# Patient Record
Sex: Female | Born: 1990 | Race: Black or African American | Hispanic: No | Marital: Single | Smoking: Never smoker
Health system: Southern US, Community
[De-identification: ages and names within clinical notes are randomized; demographics above are authoritative.]

---

## 2020-05-22 ENCOUNTER — Other Ambulatory Visit: Payer: Self-pay

## 2020-05-22 ENCOUNTER — Emergency Department (HOSPITAL_COMMUNITY)
Admission: EM | Admit: 2020-05-22 | Discharge: 2020-05-22 | Disposition: A | Discharge status: Home / Self Care | Payer: HRSA Program | Attending: Emergency Medicine | Admitting: Emergency Medicine

## 2020-05-22 ENCOUNTER — Emergency Department (HOSPITAL_COMMUNITY): Payer: HRSA Program

## 2020-05-22 ENCOUNTER — Encounter (HOSPITAL_COMMUNITY): Payer: Self-pay

## 2020-05-22 DIAGNOSIS — R1013 Epigastric pain: Secondary | ICD-10-CM | POA: Diagnosis not present

## 2020-05-22 DIAGNOSIS — U071 COVID-19: Secondary | ICD-10-CM

## 2020-05-22 DIAGNOSIS — R1111 Vomiting without nausea: Secondary | ICD-10-CM | POA: Diagnosis not present

## 2020-05-22 DIAGNOSIS — R0602 Shortness of breath: Secondary | ICD-10-CM | POA: Diagnosis present

## 2020-05-22 DIAGNOSIS — R112 Nausea with vomiting, unspecified: Secondary | ICD-10-CM

## 2020-05-22 LAB — CBC
HCT: 47.4 % — ABNORMAL HIGH (ref 36.0–46.0)
Hemoglobin: 15.4 g/dL — ABNORMAL HIGH (ref 12.0–15.0)
MCH: 29.2 pg (ref 26.0–34.0)
MCHC: 32.5 g/dL (ref 30.0–36.0)
MCV: 89.9 fL (ref 80.0–100.0)
Platelets: 179 10*3/uL (ref 150–400)
RBC: 5.27 MIL/uL — ABNORMAL HIGH (ref 3.87–5.11)
RDW: 13.2 % (ref 11.5–15.5)
WBC: 3.9 10*3/uL — ABNORMAL LOW (ref 4.0–10.5)
nRBC: 0 % (ref 0.0–0.2)

## 2020-05-22 LAB — COMPREHENSIVE METABOLIC PANEL
ALT: 28 U/L (ref 0–44)
AST: 39 U/L (ref 15–41)
Albumin: 4.3 g/dL (ref 3.5–5.0)
Alkaline Phosphatase: 43 U/L (ref 38–126)
Anion gap: 17 — ABNORMAL HIGH (ref 5–15)
BUN: 13 mg/dL (ref 6–20)
CO2: 20 mmol/L — ABNORMAL LOW (ref 22–32)
Calcium: 9.2 mg/dL (ref 8.9–10.3)
Chloride: 98 mmol/L (ref 98–111)
Creatinine, Ser: 1.03 mg/dL — ABNORMAL HIGH (ref 0.44–1.00)
GFR, Estimated: >60 mL/min (ref >60)
Glucose, Bld: 87 mg/dL (ref 70–99)
Potassium: 3.4 mmol/L — ABNORMAL LOW (ref 3.5–5.1)
Sodium: 135 mmol/L (ref 135–145)
Total Bilirubin: 1.1 mg/dL (ref 0.3–1.2)
Total Protein: 7.7 g/dL (ref 6.5–8.1)

## 2020-05-22 LAB — RESP PANEL BY RT-PCR (FLU A&B, COVID) ARPGX2
Influenza A by PCR: NEGATIVE
Influenza B by PCR: NEGATIVE
SARS Coronavirus 2 by RT PCR: POSITIVE — AB

## 2020-05-22 LAB — LIPASE, BLOOD: Lipase: 26 U/L (ref 11–51)

## 2020-05-22 LAB — I-STAT BETA HCG BLOOD, ED (MC, WL, AP ONLY): I-stat hCG, quantitative: <5 m[IU]/mL (ref <5)

## 2020-05-22 MED ORDER — ONDANSETRON 4 MG PO TBDP: ORAL_TABLET | ORAL | 0 refills | Status: AC

## 2020-05-22 MED ORDER — ONDANSETRON 4 MG PO TBDP
4.0000 mg | ORAL_TABLET | Freq: Once | ORAL | Status: AC | PRN
  Administered 2020-05-22: 4 mg via ORAL
  Filled 2020-05-22: qty 1

## 2020-05-22 MED ORDER — SODIUM CHLORIDE 0.9 % IV BOLUS
1000.0000 mL | Freq: Once | INTRAVENOUS | Status: AC
  Administered 2020-05-22: 1000 mL via INTRAVENOUS

## 2020-05-22 MED ORDER — PROMETHAZINE HCL 25 MG/ML IJ SOLN
25.0000 mg | Freq: Once | INTRAMUSCULAR | Status: AC
  Administered 2020-05-22: 25 mg via INTRAVENOUS
  Filled 2020-05-22: qty 1

## 2020-05-22 MED ORDER — ONDANSETRON HCL 4 MG/2ML IJ SOLN
4.0000 mg | Freq: Once | INTRAMUSCULAR | Status: AC
  Administered 2020-05-22: 4 mg via INTRAVENOUS
  Filled 2020-05-22: qty 2

## 2020-05-22 NOTE — ED Triage Notes (Signed)
Pt reports she is here today due to N&V and abd pain. Pt states " I think I might have covid" Pt reports she is unable to keep anything down x4 days.

## 2020-05-22 NOTE — ED Provider Notes (Signed)
[facility] EMERGENCY DEPARTMENT Provider Note   CSN: [id number] Arrival date & time: 05/22/20  1513     History Chief Complaint  Patient presents with  . Emesis  . Abdominal Pain    Kathryn Buchanan is a 30 y.o. female otherwise healthy here presenting with vomiting and possible COVID.  Patient states that she has been vomiting for the last 3 to 4 days.  She denies any recent gatherings or known COVID exposures.  Patient states that her kids are not sick.  Patient has not vaccinated against COVID however.  Patient states that she also has some epigastric pain.  She has some mild shortness of breath and sore throat as well.  Patient denies any medical problems.   The history is provided by the patient.       History reviewed. No pertinent past medical history.  There are no problems to display for this patient.   History reviewed. No pertinent surgical history.   OB History   No obstetric history on file.     History reviewed. No pertinent family history.  Social History   Tobacco Use  . Smoking status: Never Smoker  . Smokeless tobacco: Never Used    Home Medications Prior to Admission medications   Not on File    Allergies    Patient has no known allergies.  Review of Systems   Review of Systems  Gastrointestinal: Positive for abdominal pain and vomiting.  All other systems reviewed and are negative.   Physical Exam Updated Vital Signs BP 129/87 (BP Location: Right Arm)   Pulse 90   Temp 98.3 F (36.8 C) (Oral)   Resp 18   SpO2 97%   Physical Exam Vitals and nursing note reviewed.  Constitutional:      Comments: Uncomfortable, vomiting   HENT:     Head: Normocephalic.     Mouth/Throat:     Comments: MM dry  Eyes:     Extraocular Movements: Extraocular movements intact.  Cardiovascular:     Rate and Rhythm: Normal rate and regular rhythm.     Heart sounds: Normal heart sounds.  Pulmonary:     Effort: Pulmonary effort is  normal.     Comments: Diminished bilaterally  Abdominal:     General: Abdomen is flat. Bowel sounds are normal.     Palpations: Abdomen is soft.  Skin:    General: Skin is warm.     Capillary Refill: Capillary refill takes less than 2 seconds.  Neurological:     General: No focal deficit present.  Psychiatric:        Mood and Affect: Mood normal.        Behavior: Behavior normal.     ED Results / Procedures / Treatments   Labs (all labs ordered are listed, but only abnormal results are displayed) Labs Reviewed  RESP PANEL BY RT-PCR (FLU A&B, COVID) ARPGX2 - Abnormal; Notable for the following components:      Result Value   SARS Coronavirus 2 by RT PCR POSITIVE (*)    All other components within normal limits  CBC - Abnormal; Notable for the following components:   WBC 3.9 (*)    RBC 5.27 (*)    Hemoglobin 15.4 (*)    HCT 47.4 (*)    All other components within normal limits  COMPREHENSIVE METABOLIC PANEL - Abnormal; Notable for the following components:   Potassium 3.4 (*)    CO2 20 (*)    Creatinine, Ser 1.03 (*)  Anion gap 17 (*)    All other components within normal limits  LIPASE, BLOOD  URINALYSIS, ROUTINE W REFLEX MICROSCOPIC  I-STAT BETA HCG BLOOD, ED ([facility], [facility], [facility] ONLY)    EKG None  Radiology No results found.  Procedures Procedures (including critical care time)  Medications Ordered in ED Medications  sodium chloride 0.9 % bolus 1,000 mL (has no administration in time range)  ondansetron (ZOFRAN) injection 4 mg (has no administration in time range)  ondansetron (ZOFRAN-ODT) disintegrating tablet 4 mg (4 mg Oral Given 05/22/20 1649)    ED Course  I have reviewed the triage vital signs and the nursing notes.  Pertinent labs & imaging results that were available during my care of the patient were reviewed by me and considered in my medical decision making (see chart for details).    MDM Rules/Calculators/A&P                         Kathryn Buchanan is a  30 y.o. female we are presenting with vomiting and shortness of breath.  Patient is not hypoxic and vitals are stable.  Consider viral gastroenteritis versus COVID versus other viral etiology.  Patient did not receive the COVID-vaccine.  Plan to get CBC and CMP and lipase and urinalysis and pregnancy test.  We will also test for COVID and hydrate and reassess.   9:50 PM Patient is COVID positive.  Chest x-ray is normal and labs are otherwise unremarkable.  She was given IV fluids and Zofran and felt better.  Told her to stay home for 10 days.   Final Clinical Impression(s) / ED Diagnoses Final diagnoses:  None    Rx / DC Orders ED Discharge Orders    None       Charlynne Pander, MD 05/22/20 2150

## 2020-05-22 NOTE — Discharge Instructions (Signed)
You have COVID and need to stay home for 10 days  Stay hydrated.  Take Zofran for nausea  Take Tylenol and Motrin for fever.  See your doctor for follow-up  Return to ER if you have worse vomiting, abdominal pain, trouble breathing     Person Under Monitoring Name: Kathryn Buchanan  Location: [address] [place] [address]   Infection Prevention Recommendations for Individuals Confirmed to have, or Being Evaluated for, 2019 Novel Coronavirus (COVID-19) Infection Who Receive Care at Home  Individuals who are confirmed to have, or are being evaluated for, COVID-19 should follow the prevention steps below until a healthcare provider or local or state health department says they can return to normal activities.  Stay home except to get medical care You should restrict activities outside your home, except for getting medical care. Do not go to work, school, or public areas, and do not use public transportation or taxis.  Call ahead before visiting your doctor Before your medical appointment, call the healthcare provider and tell them that you have, or are being evaluated for, COVID-19 infection. This will help the healthcare providers office take steps to keep other people from getting infected. Ask your healthcare provider to call the local or state health department.  Monitor your symptoms Seek prompt medical attention if your illness is worsening (e.g., difficulty breathing). Before going to your medical appointment, call the healthcare provider and tell them that you have, or are being evaluated for, COVID-19 infection. Ask your healthcare provider to call the local or state health department.  Wear a facemask You should wear a facemask that covers your nose and mouth when you are in the same room with other people and when you visit a healthcare provider. People who live with or visit you should also wear a facemask while they are in the same room with  you.  Separate yourself from other people in your home As much as possible, you should stay in a different room from other people in your home. Also, you should use a separate bathroom, if available.  Avoid sharing household items You should not share dishes, drinking glasses, cups, eating utensils, towels, bedding, or other items with other people in your home. After using these items, you should wash them thoroughly with soap and water.  Cover your coughs and sneezes Cover your mouth and nose with a tissue when you cough or sneeze, or you can cough or sneeze into your sleeve. Throw used tissues in a lined trash can, and immediately wash your hands with soap and water for at least 20 seconds or use an alcohol-based hand rub.  Wash your [employer] your hands often and thoroughly with soap and water for at least 20 seconds. You can use an alcohol-based hand sanitizer if soap and water are not available and if your hands are not visibly dirty. Avoid touching your eyes, nose, and mouth with unwashed hands.   Prevention Steps for Caregivers and Household Members of Individuals Confirmed to have, or Being Evaluated for, COVID-19 Infection Being Cared for in the Home  If you live with, or provide care at home for, a person confirmed to have, or being evaluated for, COVID-19 infection please follow these guidelines to prevent infection:  Follow healthcare providers instructions Make sure that you understand and can help the patient follow any healthcare provider instructions for all care.  Provide for the patients basic needs You should help the patient with basic needs in the home  and provide support for getting groceries, prescriptions, and other personal needs.  Monitor the patients symptoms If they are getting sicker, call his or her medical provider and tell them that the patient has, or is being evaluated for, COVID-19 infection. This will help the healthcare providers office  take steps to keep other people from getting infected. Ask the healthcare provider to call the local or state health department.  Limit the number of people who have contact with the patient If possible, have only one caregiver for the patient. Other household members should stay in another home or place of residence. If this is not possible, they should stay in another room, or be separated from the patient as much as possible. Use a separate bathroom, if available. Restrict visitors who do not have an essential need to be in the home.  Keep older adults, very young children, and other sick people away from the patient Keep older adults, very young children, and those who have compromised immune systems or chronic health conditions away from the patient. This includes people with chronic heart, lung, or kidney conditions, diabetes, and cancer.  Ensure good ventilation Make sure that shared spaces in the home have good air flow, such as from an air conditioner or an opened window, weather permitting.  Wash your hands often Wash your hands often and thoroughly with soap and water for at least 20 seconds. You can use an alcohol based hand sanitizer if soap and water are not available and if your hands are not visibly dirty. Avoid touching your eyes, nose, and mouth with unwashed hands. Use disposable paper towels to dry your hands. If not available, use dedicated cloth towels and replace them when they become wet.  Wear a facemask and gloves Wear a disposable facemask at all times in the room and gloves when you touch or have contact with the patients blood, body fluids, and/or secretions or excretions, such as sweat, saliva, sputum, nasal mucus, vomit, urine, or feces.  Ensure the mask fits over your nose and mouth tightly, and do not touch it during use. Throw out disposable facemasks and gloves after using them. Do not reuse. Wash your hands immediately after removing your facemask and  gloves. If your personal clothing becomes contaminated, carefully remove clothing and launder. Wash your hands after handling contaminated clothing. Place all used disposable facemasks, gloves, and other waste in a lined container before disposing them with other household waste. Remove gloves and wash your hands immediately after handling these items.  Do not share dishes, glasses, or other household items with the patient Avoid sharing household items. You should not share dishes, drinking glasses, cups, eating utensils, towels, bedding, or other items with a patient who is confirmed to have, or being evaluated for, COVID-19 infection. After the person uses these items, you should wash them thoroughly with soap and water.  Wash laundry thoroughly Immediately remove and wash clothes or bedding that have blood, body fluids, and/or secretions or excretions, such as sweat, saliva, sputum, nasal mucus, vomit, urine, or feces, on them. Wear gloves when handling laundry from the patient. Read and follow directions on labels of laundry or clothing items and detergent. In general, wash and dry with the warmest temperatures recommended on the label.  Clean all areas the individual has used often Clean all touchable surfaces, such as counters, tabletops, doorknobs, bathroom fixtures, toilets, phones, keyboards, tablets, and bedside tables, every day. Also, clean any surfaces that may have blood, body fluids, and/or secretions  or excretions on them. Wear gloves when cleaning surfaces the patient has come in contact with. Use a diluted bleach solution (e.g., dilute bleach with 1 part bleach and 10 parts water) or a household disinfectant with a label that says EPA-registered for coronaviruses. To make a bleach solution at home, add 1 tablespoon of bleach to 1 quart (4 cups) of water. For a larger supply, add  cup of bleach to 1 gallon (16 cups) of water. Read labels of cleaning products and follow  recommendations provided on product labels. Labels contain instructions for safe and effective use of the cleaning product including precautions you should take when applying the product, such as wearing gloves or eye protection and making sure you have good ventilation during use of the product. Remove gloves and wash hands immediately after cleaning.  Monitor yourself for signs and symptoms of illness Caregivers and household members are considered close contacts, should monitor their health, and will be asked to limit movement outside of the home to the extent possible. Follow the monitoring steps for close contacts listed on the symptom monitoring form.   ? If you have additional questions, contact your local health department or call the epidemiologist on call at [phone] (available 24/7). ? This guidance is subject to change. For the most up-to-date guidance from [facility], please refer to their website: [email or web address]

## 2021-06-12 ENCOUNTER — Other Ambulatory Visit: Payer: Self-pay

## 2021-06-12 ENCOUNTER — Emergency Department (HOSPITAL_COMMUNITY)
Admission: EM | Admit: 2021-06-12 | Discharge: 2021-06-13 | Disposition: A | Discharge status: Home / Self Care | Payer: Self-pay | Attending: Emergency Medicine | Admitting: Emergency Medicine

## 2021-06-12 ENCOUNTER — Encounter (HOSPITAL_COMMUNITY): Payer: Self-pay | Admitting: Emergency Medicine

## 2021-06-12 ENCOUNTER — Emergency Department (HOSPITAL_COMMUNITY): Payer: Self-pay

## 2021-06-12 DIAGNOSIS — W2203XA Walked into furniture, initial encounter: Secondary | ICD-10-CM | POA: Insufficient documentation

## 2021-06-12 DIAGNOSIS — N9489 Other specified conditions associated with female genital organs and menstrual cycle: Secondary | ICD-10-CM | POA: Insufficient documentation

## 2021-06-12 DIAGNOSIS — S060X0A Concussion without loss of consciousness, initial encounter: Secondary | ICD-10-CM | POA: Insufficient documentation

## 2021-06-12 DIAGNOSIS — S060XAA Concussion with loss of consciousness status unknown, initial encounter: Secondary | ICD-10-CM

## 2021-06-12 LAB — BASIC METABOLIC PANEL
Anion gap: 7 (ref 5–15)
BUN: 15 mg/dL (ref 6–20)
CO2: 26 mmol/L (ref 22–32)
Calcium: 9.1 mg/dL (ref 8.9–10.3)
Chloride: 105 mmol/L (ref 98–111)
Creatinine, Ser: 0.87 mg/dL (ref 0.44–1.00)
GFR, Estimated: >60 mL/min (ref >60)
Glucose, Bld: 83 mg/dL (ref 70–99)
Potassium: 3.7 mmol/L (ref 3.5–5.1)
Sodium: 138 mmol/L (ref 135–145)

## 2021-06-12 LAB — CBC WITH DIFFERENTIAL/PLATELET
Abs Immature Granulocytes: 0.01 10*3/uL (ref 0.00–0.07)
Basophils Absolute: 0.1 10*3/uL (ref 0.0–0.1)
Basophils Relative: 1 %
Eosinophils Absolute: 0.1 10*3/uL (ref 0.0–0.5)
Eosinophils Relative: 1 %
HCT: 36.7 % (ref 36.0–46.0)
Hemoglobin: 12 g/dL (ref 12.0–15.0)
Immature Granulocytes: 0 %
Lymphocytes Relative: 42 %
Lymphs Abs: 2.4 10*3/uL (ref 0.7–4.0)
MCH: 30.3 pg (ref 26.0–34.0)
MCHC: 32.7 g/dL (ref 30.0–36.0)
MCV: 92.7 fL (ref 80.0–100.0)
Monocytes Absolute: 0.5 10*3/uL (ref 0.1–1.0)
Monocytes Relative: 9 %
Neutro Abs: 2.8 10*3/uL (ref 1.7–7.7)
Neutrophils Relative %: 47 %
Platelets: 254 10*3/uL (ref 150–400)
RBC: 3.96 MIL/uL (ref 3.87–5.11)
RDW: 13.3 % (ref 11.5–15.5)
WBC: 5.8 10*3/uL (ref 4.0–10.5)
nRBC: 0 % (ref 0.0–0.2)

## 2021-06-12 LAB — I-STAT BETA HCG BLOOD, ED (MC, WL, AP ONLY): I-stat hCG, quantitative: <5 m[IU]/mL (ref <5)

## 2021-06-12 NOTE — ED Triage Notes (Signed)
Patient coming from home, complaint of dizziness and headache following striking head on towel rack on Saturday.
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
## 2021-06-13 MED ORDER — ACETAMINOPHEN 500 MG PO TABS
1000.0000 mg | ORAL_TABLET | Freq: Once | ORAL | Status: AC
  Administered 2021-06-13: 1000 mg via ORAL
  Filled 2021-06-13: qty 2
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
## 2021-06-13 NOTE — Discharge Instructions (Signed)
You came to the emergency department today to be evaluated for your headache and dizziness after suffering a head injury.  Your lab work, CT imaging, and physical exam were reassuring.  Your symptoms are likely due to a concussion.  Please read the attached paperwork on concussions.  Please follow-up with your primary care doctor for repeat evaluation.  Please take Ibuprofen (Advil, motrin) and Tylenol (acetaminophen) to relieve your pain.    You may take up to 600 MG (3 pills) of normal strength ibuprofen every 8 hours as needed.   You make take tylenol, up to 1,000 mg (two extra strength pills) every 8 hours as needed.   It is safe to take ibuprofen and tylenol at the same time as they work differently.   Do not take more than 3,000 mg tylenol in a 24 hour period (not more than one dose every 8 hours.  Please check all medication labels as many medications such as pain and cold medications may contain tylenol.  Do not drink alcohol while taking these medications.  Do not take other NSAID'S while taking ibuprofen (such as aleve or naproxen).  Please take ibuprofen with food to decrease stomach upset.  Get help right away if: You have new or worsening physical symptoms, such as: A severe or worsening headache. Weakness or numbness in any part of your body, slurred speech, vision changes, or confusion. Your coordination gets worse. Vomiting repeatedly. You have a seizure. You have unusual behavior changes. You lose consciousness, are sleepier than normal, or are difficult to wake up.

## 2021-06-13 NOTE — ED Notes (Signed)
This RN notified by lobby NT that pt was requesting medication for HA. PA made aware by this Rn

## 2023-07-31 IMAGING — CT CT HEAD W/O CM
3 series · 15 of 47 positions shown, 18 images · non-contrast
Comparison: None.

CLINICAL DATA: Head trauma.



[Series 3: head 5.0 h30s · axial · 0.42mm/px · z∈[+1024,+1149]mm · 9 of 30 slices shown, 12 images]
[im 3/30  brain]
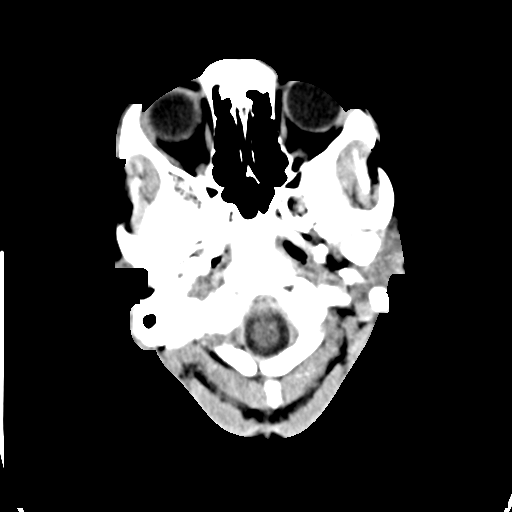
[im 3/30  bone]
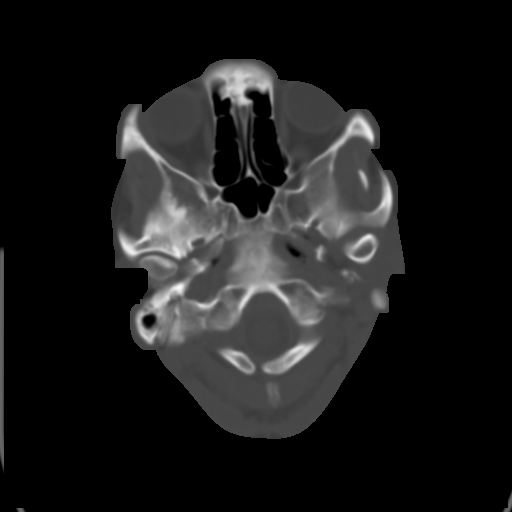
[im 6/30  brain]
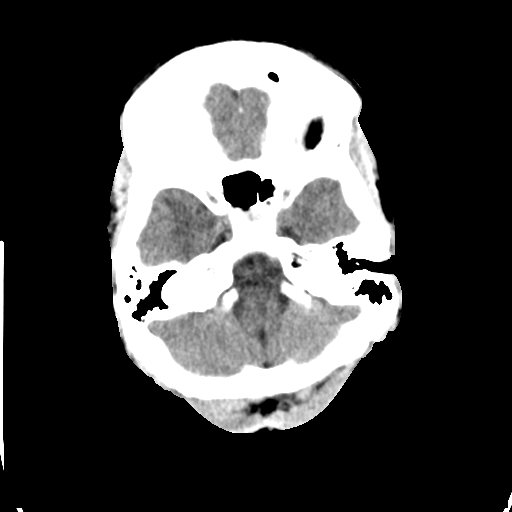
[im 9/30  brain]
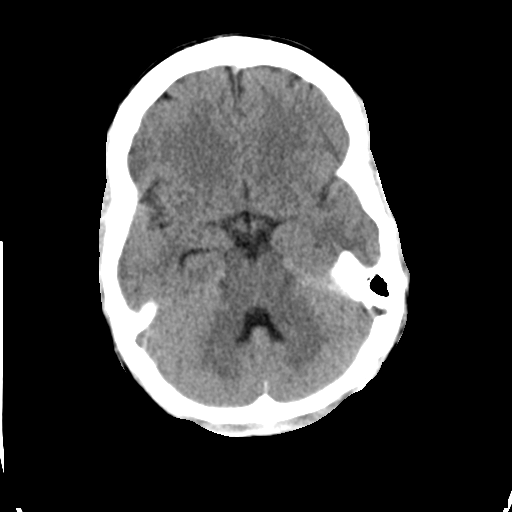
[im 12/30  brain]
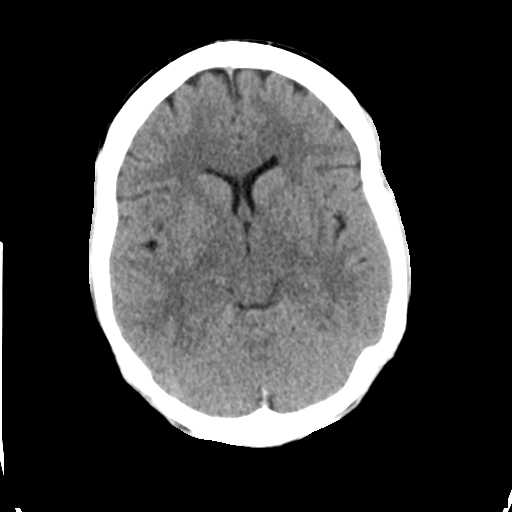
[im 16/30  brain]
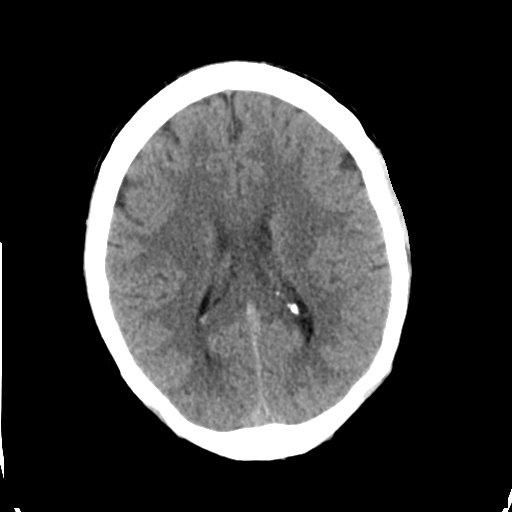
[im 16/30  bone]
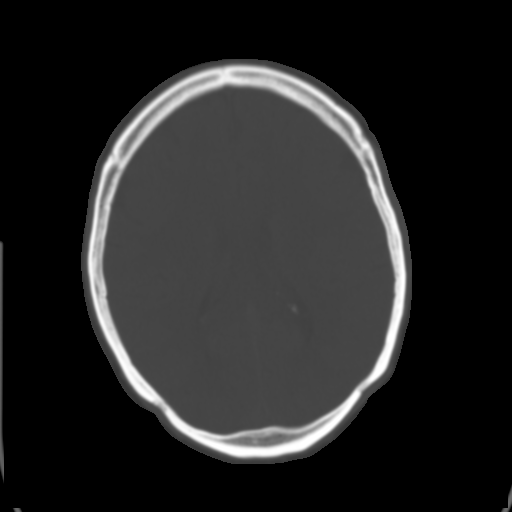
[im 19/30  brain]
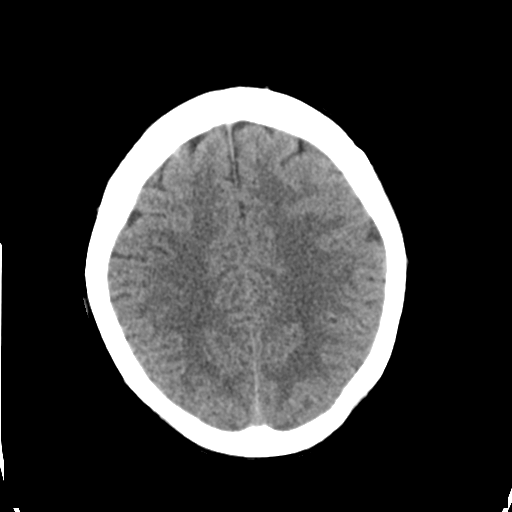
[im 22/30  brain]
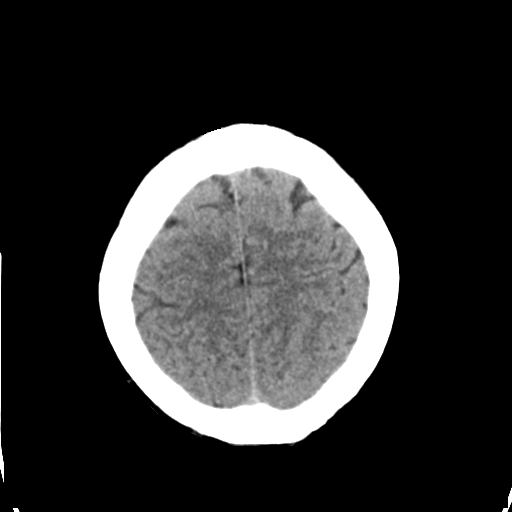
[im 25/30  brain]
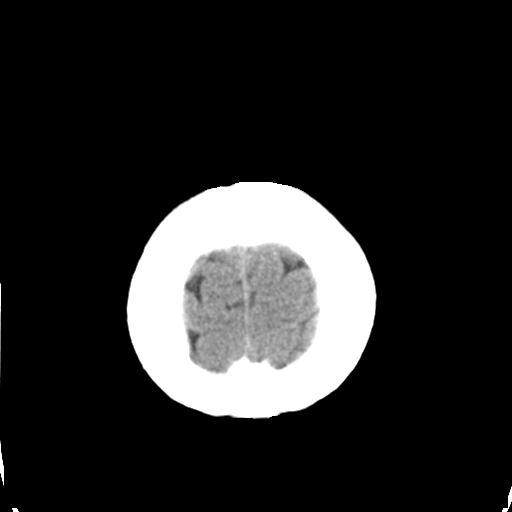
[im 28/30  brain]
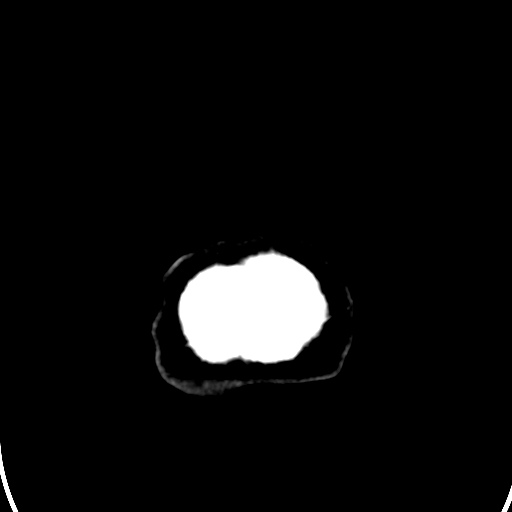
[im 28/30  bone]
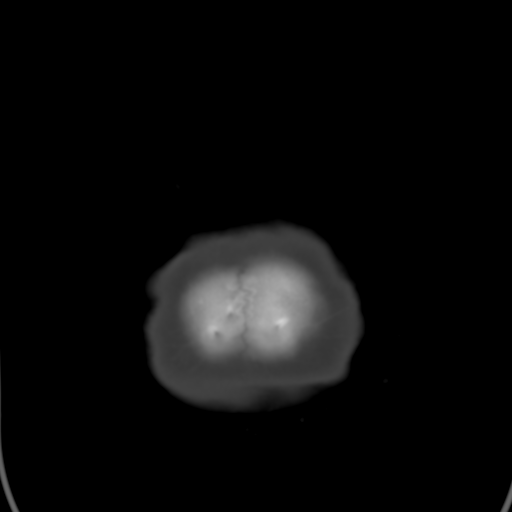

[Series 5: head 3.0 mpr cor · coronal · 0.28mm/px · 3 of 63 slices shown]
[im 21/63  brain]
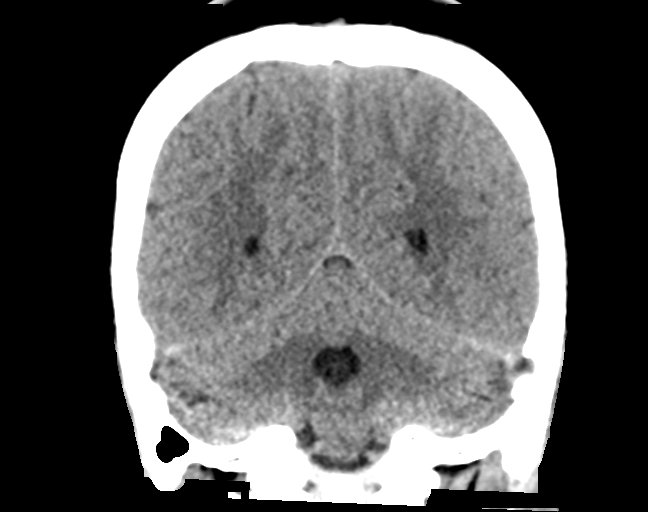
[im 28/63  brain]
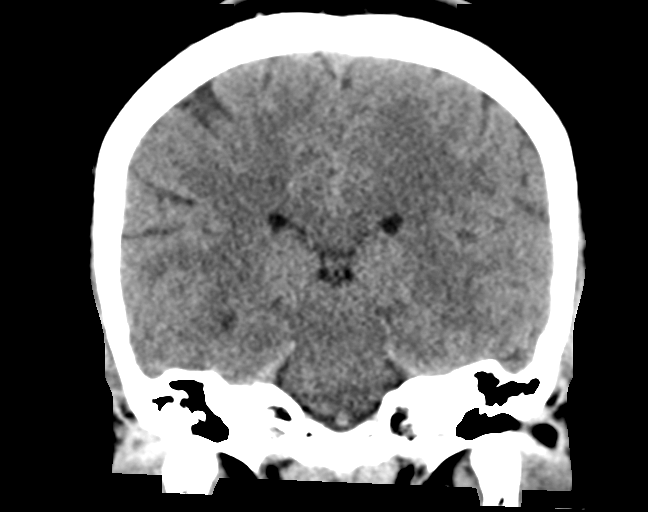
[im 35/63  brain]
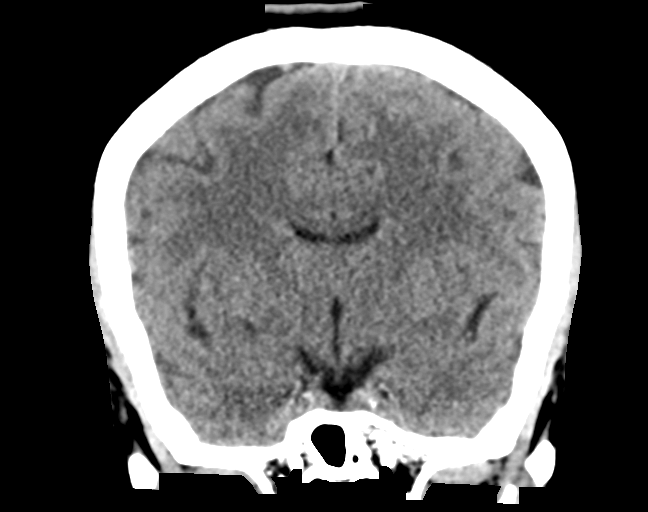

[Series 6: head 3.0 mpr sag · sagittal · 0.28mm/px · 3 of 52 slices shown]
[im 18/52  brain]
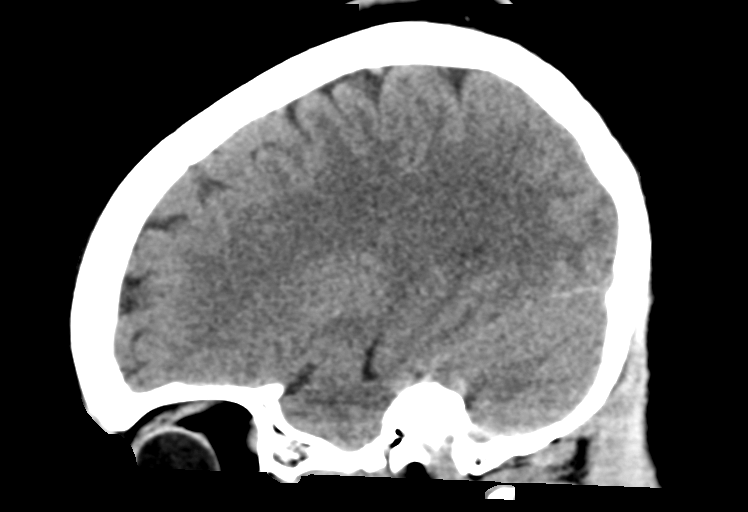
[im 26/52  brain]
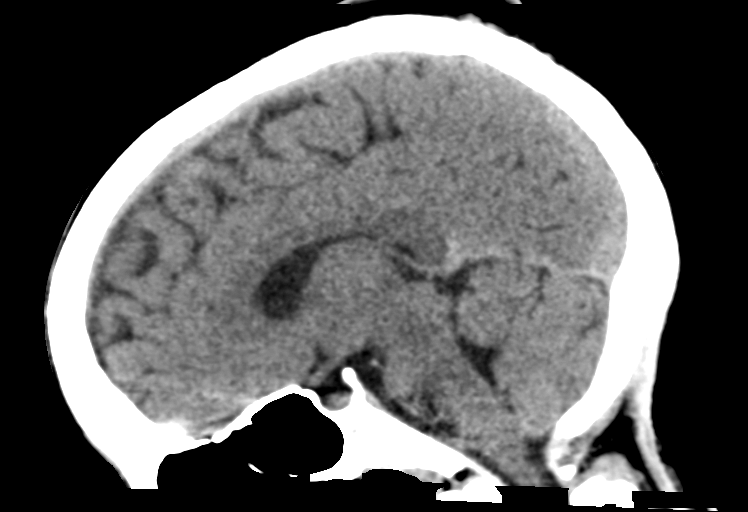
[im 35/52  brain]
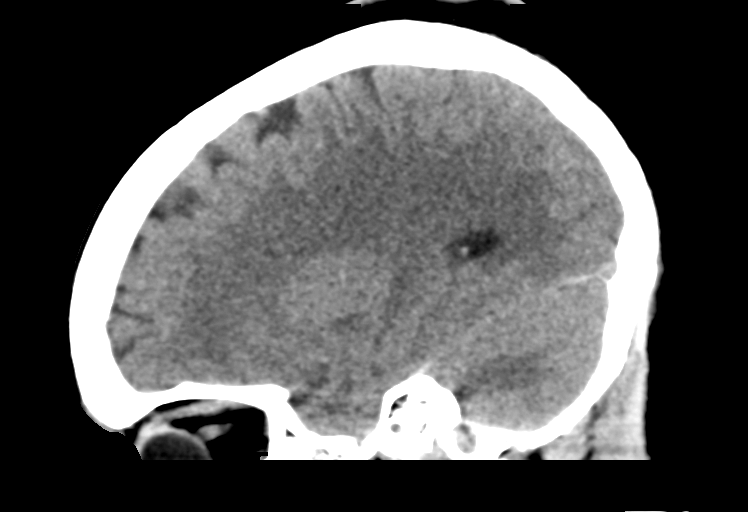

[15 of 47 positions shown; findings below may reference images not displayed]

FINDINGS: Brain: No evidence of acute infarction, hemorrhage, hydrocephalus,
extra-axial collection or mass lesion/mass effect.

Vascular: No hyperdense vessel or unexpected calcification.

Skull: Normal. Negative for fracture or focal lesion.

Sinuses/Orbits: No acute finding.

Other: None.
IMPRESSION: No acute intracranial abnormality.
